# Patient Record
Sex: Male | Born: 1995 | Race: White | Hispanic: No | Marital: Single | Smoking: Never smoker
Health system: Southern US, Community
[De-identification: ages and names within clinical notes are randomized; demographics above are authoritative.]

---
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
## 2017-04-30 ENCOUNTER — Other Ambulatory Visit: Payer: Self-pay

## 2017-04-30 ENCOUNTER — Emergency Department (HOSPITAL_COMMUNITY): Payer: No Typology Code available for payment source

## 2017-04-30 ENCOUNTER — Emergency Department (HOSPITAL_COMMUNITY)
Admission: EM | Admit: 2017-04-30 | Discharge: 2017-04-30 | Disposition: A | Discharge status: Home / Self Care | Payer: No Typology Code available for payment source | Attending: Emergency Medicine | Admitting: Emergency Medicine

## 2017-04-30 ENCOUNTER — Encounter (HOSPITAL_COMMUNITY): Payer: Self-pay | Admitting: Emergency Medicine

## 2017-04-30 DIAGNOSIS — Y9389 Activity, other specified: Secondary | ICD-10-CM | POA: Diagnosis not present

## 2017-04-30 DIAGNOSIS — S060X1A Concussion with loss of consciousness of 30 minutes or less, initial encounter: Secondary | ICD-10-CM | POA: Diagnosis not present

## 2017-04-30 DIAGNOSIS — M25561 Pain in right knee: Secondary | ICD-10-CM | POA: Diagnosis not present

## 2017-04-30 DIAGNOSIS — S0001XA Abrasion of scalp, initial encounter: Secondary | ICD-10-CM | POA: Diagnosis not present

## 2017-04-30 DIAGNOSIS — I10 Essential (primary) hypertension: Secondary | ICD-10-CM | POA: Insufficient documentation

## 2017-04-30 DIAGNOSIS — S20212A Contusion of left front wall of thorax, initial encounter: Secondary | ICD-10-CM | POA: Diagnosis not present

## 2017-04-30 DIAGNOSIS — Y92410 Unspecified street and highway as the place of occurrence of the external cause: Secondary | ICD-10-CM | POA: Diagnosis not present

## 2017-04-30 DIAGNOSIS — S0990XA Unspecified injury of head, initial encounter: Secondary | ICD-10-CM | POA: Diagnosis present

## 2017-04-30 DIAGNOSIS — Y999 Unspecified external cause status: Secondary | ICD-10-CM | POA: Insufficient documentation

## 2017-04-30 NOTE — ED Notes (Signed)
Patient transported to CT 

## 2017-04-30 NOTE — Discharge Instructions (Signed)
Please follow up with your doctor for recheck of bp this week. Recheck for further care if any memory issues or headaches occur after your concussion Take tylenol and ibuprofen as needed for pain. Return to ED if any further problems or worsening of symptoms.
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
## 2017-04-30 NOTE — ED Notes (Signed)
Patient transported to X-ray 
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
## 2018-07-09 ENCOUNTER — Emergency Department
Admission: EM | Admit: 2018-07-09 | Discharge: 2018-07-09 | Discharge status: Absent without leave | Payer: Self-pay | Source: Home / Self Care
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
